# Patient Record
Sex: Female | Born: 1980 | Race: White | Hispanic: No | Marital: Married
Health system: Midwestern US, Academic
[De-identification: ages and names within clinical notes are randomized; demographics above are authoritative.]

## PROBLEM LIST (undated history)

## (undated) DIAGNOSIS — I2699 Other pulmonary embolism without acute cor pulmonale: ICD-10-CM

---

## 2017-05-17 ENCOUNTER — Encounter: Admit: 2017-05-17 | Discharge: 2017-05-18 | Payer: BC Managed Care – PPO

## 2017-05-17 DIAGNOSIS — R69 Illness, unspecified: Principal | ICD-10-CM

## 2017-05-18 ENCOUNTER — Encounter: Admit: 2017-05-18 | Discharge: 2017-05-18 | Payer: BC Managed Care – PPO

## 2017-05-18 ENCOUNTER — Ambulatory Visit: Admit: 2017-05-18 | Discharge: 2017-05-18 | Payer: BC Managed Care – PPO

## 2017-05-18 DIAGNOSIS — I2692 Saddle embolus of pulmonary artery without acute cor pulmonale: Principal | ICD-10-CM

## 2017-05-18 DIAGNOSIS — I2699 Other pulmonary embolism without acute cor pulmonale: ICD-10-CM

## 2017-05-18 DIAGNOSIS — R69 Illness, unspecified: Principal | ICD-10-CM

## 2017-05-18 MED ORDER — ACETAMINOPHEN 325 MG PO TAB
650 mg | ORAL | 0 refills | Status: DC | PRN
Start: 2017-05-18 — End: 2017-05-19
  Administered 2017-05-18 – 2017-05-19 (×3): 650 mg via ORAL

## 2017-05-18 MED ORDER — APIXABAN 5 MG PO TAB
10 mg | Freq: Two times a day (BID) | ORAL | 0 refills | Status: DC
Start: 2017-05-18 — End: 2017-05-19
  Administered 2017-05-19 (×2): 10 mg via ORAL

## 2017-05-18 MED ORDER — PERFLUTREN LIPID MICROSPHERES 1.1 MG/ML IV SUSP
1-20 mL | Freq: Once | INTRAVENOUS | 0 refills | Status: CP
Start: 2017-05-18 — End: ?
  Administered 2017-05-18: 1 mL via INTRAVENOUS

## 2017-05-18 NOTE — Care Plan
Reason For Transfer: EMTALA    Transfer Location: [facility]    Current Level of Service: ED    Discussed with Referring Provider (yes/no): NO    Transfer Center Triage Contact: Dannette BarbaraSharon    Pre-Transfer Course: [age] yof with hx thrombophlebitis, recent R calf pain, who started OCP 1 month ago and now presents with acute onset of CP, SOB this am.  Found to be hypoxia on admission to ER to sats of 70s.  CTA chest shows saddle PE.  BLE US neg forr DVTs.  VS AF, 120, 118/80, 20, 99% on 6 L NC.  labs remarkable for D Dimer 3500, WBC 12.3.  pt rec'd eliquis, 1 L NS, and fentanyl.  FHx positive with grandmother who had a recent diagnosis of PE too.  Pt accepted to a tele bed.

## 2017-05-18 NOTE — Progress Notes
36 yr with sudden onset of dyspnea with chest pain @ 0630, hypoxic in the 70's. CTA shows saddle PE.  Patient has recent hx of right calf pain.  US negative for DVT.  Her grandmother dxed w/saddle PE earlier this year.  BCP started one month ago, otherwise healthy.  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

## 2017-05-19 ENCOUNTER — Encounter: Admit: 2017-05-19 | Discharge: 2017-05-19 | Payer: BC Managed Care – PPO

## 2017-05-19 ENCOUNTER — Inpatient Hospital Stay: Admit: 2017-05-19 | Discharge: 2017-05-19 | Payer: BC Managed Care – PPO

## 2017-05-19 DIAGNOSIS — M549 Dorsalgia, unspecified: Principal | ICD-10-CM

## 2017-05-19 LAB — CARDIOLIPIN AB IGG/IGM

## 2017-05-19 LAB — CBC: Lab: 8.2 10*3/uL (ref 4.5–11.0)

## 2017-05-19 LAB — ANTITHROMBIN III (AT3): Lab: 117 % (ref <20.0)

## 2017-05-19 LAB — TROPONIN-I
Lab: 0.7 ng/mL — ABNORMAL HIGH (ref >60)
Lab: 0.5 ng/mL — ABNORMAL HIGH (ref 0.0–0.05)

## 2017-05-19 LAB — PREGNANCY TEST-URINE
Lab: NEGATIVE
Lab: 1

## 2017-05-19 LAB — BETA 2 GLYCOPROTEIN 1 AB, IGG

## 2017-05-19 LAB — BASIC METABOLIC PANEL: Lab: 140 MMOL/L (ref 137–147)

## 2017-05-19 LAB — BNP (B-TYPE NATRIURETIC PEPTI): Lab: 117 pg/mL — ABNORMAL HIGH (ref 0–100)

## 2017-05-19 MED ORDER — ACETAMINOPHEN 325 MG PO TAB
650 mg | ORAL | 0 refills | Status: DC | PRN
Start: 2017-05-19 — End: 2017-05-19
  Administered 2017-05-19: 18:00:00 650 mg via ORAL

## 2017-05-19 MED ORDER — MAGNESIUM SULFATE IN D5W 1 GRAM/100 ML IV PGBK
1 g | Freq: Once | INTRAVENOUS | 0 refills | Status: CP
Start: 2017-05-19 — End: ?
  Administered 2017-05-19: 21:00:00 1 g via INTRAVENOUS

## 2017-05-19 MED ORDER — FENTANYL CITRATE (PF) 50 MCG/ML IJ SOLN
50 ug | Freq: Once | INTRAVENOUS | 0 refills | Status: CP
Start: 2017-05-19 — End: ?
  Administered 2017-05-19: 50 ug via INTRAVENOUS

## 2017-05-19 MED ORDER — ENOXAPARIN 120 MG/0.8 ML SC SYRG
1 mg/kg | Freq: Two times a day (BID) | SUBCUTANEOUS | 0 refills | Status: DC
Start: 2017-05-19 — End: 2017-05-23
  Administered 2017-05-20 – 2017-05-22 (×6): 110 mg via SUBCUTANEOUS

## 2017-05-19 MED ORDER — PROCHLORPERAZINE EDISYLATE 5 MG/ML IJ SOLN
5 mg | Freq: Once | INTRAVENOUS | 0 refills | Status: CP
Start: 2017-05-19 — End: ?
  Administered 2017-05-19: 21:00:00 5 mg via INTRAVENOUS

## 2017-05-19 MED ORDER — ACETAMINOPHEN 500 MG PO TAB
1000 mg | Freq: Three times a day (TID) | ORAL | 0 refills | Status: DC | PRN
Start: 2017-05-19 — End: 2017-05-23
  Administered 2017-05-19 – 2017-05-22 (×10): 1000 mg via ORAL

## 2017-05-19 MED ORDER — IOPAMIDOL 61 % IV SOLN
40 mL | Freq: Once | INTRAVENOUS | 0 refills | Status: CP
Start: 2017-05-19 — End: ?
  Administered 2017-05-20: 40 mL via INTRAVENOUS

## 2017-05-19 NOTE — Case Management (ED)
Case Management Progress Note    NAME:Jordan Wang                          MRN: [id number]              DOB:21-Jun-1981          AGE: 36 y.o.  ADMISSION DATE: 05/18/2017             DAYS ADMITTED: LOS: 1 day      Today???s Date: 05/19/2017    Plan  Pt to discharge with anticoagulation medication.    Interventions  ? Support      ? Info or Referral      ? Discharge Planning      ? Medication Needs   Medication Needs: Co-Pay Check   Spoke with patients Rx coverage, [employer].  Pt has not yet met Rx deductible, will meet this deductible after 1st week of eliquis.  Per [employer] patient will owe the following:    Eliquis 10mg  BID for 7 days: $95.54  Eliquis 5mg  BID, 1 month supply after deductible is met: $96.48 ($182.67 if deductible is not met)    ? Financial      ? Legal      ? Other        Disposition  ? Expected Discharge Date    Expected Discharge Date: 05/23/17  ? Transportation   Does the patient need discharge transport arranged?: No  Transportation Name, Phone and Availability #1: spouse, Ronaldo Miyamoto [phone]  ? Next Level of Care (Acute Psych discharges only)      ? Discharge Disposition         Durable Medical Equipment     No service has been selected for the patient.      [facility]     No service has been selected for the patient.      [facility]     No service has been selected for the patient.      [facility]     No service has been selected for the patient.                Case Management Admission Assessment    NAME:Jordan Wang                          MRN: [id number]             DOB:March 27, 1981          AGE: 36 y.o.  ADMISSION DATE: 05/18/2017             DAYS ADMITTED: LOS: 1 day      Today???s Date: 05/19/2017    Source of Information: patient       Plan  Plan: CM Assessment, Assist PRN with SW/NCM Services, Discharge Planning for Home Anticipated   - Met with Mrs. losier and explained role of NCM.  - Reviewed plan for hospital admission and discharge.  - Anticipates discharge to home. - Transportation on discharge will be provided by husband, Ronaldo Miyamoto [phone]).  - pt to discharge with anticoagulation.  Co-pay check for eliquis complete.      Patient Address/Phone  [address]  [place] [address]  [phone] (home)     Emergency Contact  Extended Emergency Contact Information  Primary Emergency Contact: Ronelle Nigh [place]  Home Phone: [phone]  Mobile Phone: [phone]  Relation: Spouse  Healthcare Directive  Healthcare Directive: No, patient does not have a healthcare directive  Would patient like to fill out a (a new) Healthcare Directive?: No, patient declined      Transportation  Does the patient need discharge transport arranged?: No  Transportation Name, Phone and Availability #1: spouse, Ronaldo Miyamoto [phone]    Expected Discharge Date  Expected Discharge Date: 05/23/17    Living Situation Prior to Admission  ? Living Arrangements  Type of Residence: Home, independent  Living Arrangements: Spouse/significant other, Children (children age: 64, 100, twins are 58)  Financial risk analyst / Tub: Tub/Shower Unit  How many levels in the residence?: 3  Can patient live on one level if needed?: No  Does residence have entry and/or side stairs?: Yes (3 to enter)  Assistance needed prior to admit or anticipated on discharge: No  Who provides assistance or could if needed?: spouse and mother lives nearby  Are they in good health?: Unknown  Can support system provide 24/7 care if needed?: Maybe  ? Level of Function   Prior level of function: Independent  ? Cognitive Abilities   Cognitive Abilities: Alert and Oriented, Engages in problem solving and planning, Recognizes impact of health condition on lifestyle, Understands nature of health condition, Participates in decision making    Financial Resources  ? Coverage  Primary Insurance: Nurse, learning disability  Additional Coverage: RX ([employer])    ? Source of Income   Source Of Income: Employed  ? Financial Assistance Needed?  denies Psychosocial Needs  ? Mental Health  Mental Health History: No  ? Substance Use History  Substance Use History Screen: No  ? Other  denies    Current/Previous Services  ? PCP  [place], [place], [phone], [phone]  ? Pharmacy    [facility])  [phone] [address]  [place] [address]  Phone: [phone] Fax: [phone]    ? Durable Medical Equipment   Durable Medical Equipment at home: None  ? Home Health  Receiving home health: No  ? Hemodialysis or Peritoneal Dialysis  Undergoing hemodialysis or peritoneal dialysis: No  ? Tube/Enteral Feeds  Receive tube/enteral feeds: No  ? Infusion  Receive infusions: No  ? Private Duty  Private duty help used: No  ? Home and Community Based Services  Home and community based services: No  ? [employer]: N/A  ? Hospice  Hospice: No  ? Outpatient Therapy  PT: In the past  When did patient receive care?: 2017  Name of rehab location/group: [facility]  Would patient return for future services?: Yes  OT: No  SLP: No  ? Skilled Nursing Facility/Nursing Home  SNF: No  NH: No  ? Inpatient Rehab  IPR: No  ? Long-Term Acute Care Hospital  LTACH: No  ? Acute Hospital Stay  Acute Hospital Stay: No    Grayland Ormond, RN  Integrated Nursing Case Manager  Phone: [phone]  Pager: [phone]

## 2017-05-19 NOTE — Progress Notes
Gynecology Update Note     Patient wishes to discuss contraception options with her husband. She is considering following up with her OB/Gyn to get. Gynecology team will sign off. Please contact us if patient wishes to have nexplanon or IUD placed. Depo and progesterone pills also available inpatient.       Rosalio LoudHillary Nahom Carfagno, MD  PGY-3 Obsterics and Gynecology  Gyn page [phone]

## 2017-05-19 NOTE — Progress Notes
General Progress Note    Name:  Jordan Wang   Today's Date:  05/19/2017  Admission Date: 05/18/2017  LOS: 1 day                     Assessment/Plan:    Active Problems:    Pulmonary embolus (HCC)    Saddle pumonary embolus   Unprovoked   -Hemodynamically stable   -Has been on Ortho Tri-Cyclen for ~1 month for heavy menstrual cycles however she is a non-smoker  -OSH CT chest with saddle PE  -OSH R lower DVT showed superficial clot but no evidence of DVT  -Started on apixaban at OSH, has received 3 doses now  -Echo with:RV is moderately dilated with mild to moderate mid wall dysfunction seen in some views, although TAPSE and tissue doppler appear normal. Normal left ventricular systolic function with EF = 65% and normal diastolic function. Normal cardiac valve structure and function. Estimated pulmonary artery systolic pressure is 24 mmHg. Saddle PE (by history) was not seen in this limited study with poor visualization; however, this echo fits otherwise with PE as etiology.    -Repeat bilateral US ordered at [facility] [place] 05/19/2017--> Deep vein thrombosis, primarily acute in the right popliteal, posterior tibial and peroneal veins. No evidence of femoral popliteal deep vein thrombosis in the left leg  -Troponin 0.79 on admit, now 0.53  Plan:  -Pulm CC on board, appreciate assistance with management   >DC apixaban and do Lovonox 1mg /kg BID tonight and over the weekend in case she will need intervention on Monday  -Hypercoagulable work-up with factor five leiden, protein C and S, anticardiolipin, antib2 glycoprotein, lupus coagulant    -Consult gynecology for assistance with management since she has hx of menorrhagia and now stopping OCP, starting on anticoagulation  -Tylenol 1000mg  TID for pain control    Anemia   Hx of menorrhagia   Stable, continue to monitor   Plan as above    FEN: No fluids, replace electrolytes as needed, regular diet   PPX: on therapeutic lovonox  Code: FULL Dispo: Continue admit to med-2 for monitoring     Patient seen and discussed with Dr. Yetta Barre   ________________________________________________________________________    Subjective  Jordan Wang is a 36 y.o. female.  Patient is doing well and is feeling better. She is resting comfortably on RA, not in any respiratory distress. She complains of unchanged chest pain and a 'sinus-like HA'.     Denies fevers, chills, N, V, palpitations, SOB,  leg pain, diarrhea or constipation        Medications  Scheduled Meds:  apixaban (ELIQUIS) tablet 10 mg 10 mg Oral BID   Continuous Infusions:  PRN and Respiratory Meds:acetaminophen Q6H PRN      Review of Systems:  All other systems reviewed and are negative.    Objective:                          Vital Signs: Last Filed                 Vital Signs: 24 Hour Range   BP: 112/74 (11/16 0813)  Temp: 36.9 ???C (98.5 ???F) (11/16 0813)  Pulse: 91 (11/16 0813)  Respirations: 20 PER MINUTE (11/16 0813)  SpO2: 100 % (11/16 0813)  O2 Delivery: High Flow Nasal Cannula (11/16 0813)  Height: 172.7 cm (68) (11/15 1743) BP: (107-132)/(55-75)   Temp:  [36.3 ???C (97.3 ???F)-37.1 ???C (98.8 ???F)]  Pulse:  [89-114]   Respirations:  [20 PER MINUTE-21 PER MINUTE]   SpO2:  [97 %-100 %]   O2 Delivery: High Flow Nasal Cannula   Intensity Pain Scale (Self Report): 5 (05/19/17 0400) Vitals:    05/18/17 1547 05/18/17 1743   Weight: 113.1 kg (249 lb 5.4 oz) 113.1 kg (249 lb 5.4 oz)       Intake/Output Summary:  (Last 24 hours)    Intake/Output Summary (Last 24 hours) at 05/19/17 0850  Last data filed at 05/19/17 0400   Gross per 24 hour   Intake                0 ml   Output              551 ml   Net             -551 ml           Physical Exam  General:  Alert, cooperative, no distress, appears stated age  Head:  Normocephalic, without obvious abnormality, atraumatic  Eyes:  Conjunctivae/corneas clear.  PERRL, EOMs intact.  Neck:    Supple, symmetrical, trachea midline, no adenopathy, thyroid: no enlargement/tenderness/nodules, no carotid bruit and no JVD  Lungs:  Clear to auscultation bilaterally  Heart:   Regular rate and rhythm, S1, S2 normal, no murmur, click rub or gallop  Abdomen:  Soft, non-tender.  Bowel sounds normal.  No masses.  No organomegaly.  Extremities: Extremities normal, atraumatic, no cyanosis or edema. RLE TTP in mid calf  Peripheral pulses   2+ and symmetric, all extremities  Skin: Skin color, texture, turgor normal.  No rashes or lesions  Neurologic:   CNII - XII intact.  Normal strength, sensation and reflexes throughout.    Lab Review  24-hour labs:    Results for orders placed or performed during the hospital encounter of 05/18/17 (from the past 24 hour(s))   BNP (B-TYPE NATRIURETIC PEPTI)    Collection Time: 05/18/17  5:40 PM   Result Value Ref Range    B Type Natriuretic Peptide 117.0 (H) 0 - 100 PG/ML   TROPONIN-I    Collection Time: 05/18/17  5:40 PM   Result Value Ref Range    Troponin-I 0.79 (H) 0.0 - 0.05 NG/ML   PREGNANCY TEST-URINE    Collection Time: 05/18/17  6:40 PM   Result Value Ref Range    Urine-HCG NEG     Specific Gravity 1.021    TROPONIN-I    Collection Time: 05/18/17 11:56 PM   Result Value Ref Range    Troponin-I 0.53 (H) 0.0 - 0.05 NG/ML   CBC    Collection Time: 05/18/17 11:56 PM   Result Value Ref Range    White Blood Cells 8.2 4.5 - 11.0 K/UL    RBC 4.08 4.0 - 5.0 M/UL    Hemoglobin 11.4 (L) 12.0 - 15.0 GM/DL    Hematocrit 16.1 (L) 36 - 45 %    MCV 83.0 80 - 100 FL    MCH 28.1 26 - 34 PG    MCHC 33.8 32.0 - 36.0 G/DL    RDW 09.6 11 - 15 %    Platelet Count 194 150 - 400 K/UL    MPV 7.6 7 - 11 FL   BASIC METABOLIC PANEL    Collection Time: 05/18/17 11:56 PM   Result Value Ref Range    Sodium 140 137 - 147 MMOL/L    Potassium 3.8 3.5 - 5.1 MMOL/L  Chloride 110 98 - 110 MMOL/L    CO2 23 21 - 30 MMOL/L    Anion Gap 7 3 - 12    Glucose 98 70 - 100 MG/DL    Blood Urea Nitrogen 9 7 - 25 MG/DL    Creatinine 9.60 0.4 - 1.00 MG/DL    Calcium 9.2 8.5 - 45.4 MG/DL eGFR Non African American >60 >60 mL/min    eGFR African American >60 >60 mL/min       Point of Care Testing  (Last 24 hours)  Glucose: 98 (05/18/17 2356)    Radiology and other Diagnostics Review:    Pertinent radiology reviewed.    Lavenia Atlas, MD   Pager

## 2017-05-19 NOTE — H&P (View-Only)
IR Pre Procedure History and Physical/Sedation Plan    Name:Jordan Wang                                                                   MRN: [id number]                 DOB:03-Dec-1980          Age: 36 y.o.  Admission Date: 05/18/2017             Days Admitted: LOS: 1 day      Procedure Date: 05/19/2017     Planned Procedure(s):  IVC filter placement  Indication:  DVT, saddle pulmonary embolus    ________________________________________________________________________    Chief Complaint:  DVT    History of Present Illness: Jordan Wang is a 36 y.o. female.  Here for filter placement. No new acute complaints.  Active Problems:    Pulmonary embolus [facility])    Past Medical History:   Diagnosis Date   ??? Back pain       Past Surgical History:   Procedure Laterality Date   ??? CESAREAN SECTION     ??? CHOLECYSTECTOMY     ??? PR COMPLEX CYSTOMETROGRAM URETHRAL PRESS PROFILE      Urethral cyst removal    ??? WISDOM TEETH EXTRACTION          Medications:  Inpatient Scheduled Meds:  [MAR Hold] enoxaparin (LOVENOX) syringe 110 mg 1 mg/kg Subcutaneous BID   Continuous Infusions:  PRN and Respiratory Meds:[MAR Hold] acetaminophen TID PRN    Allergies:  Hydromorphone; Sulfamethoxazole-trimethoprim; and Trimethoprim    Social History     Social History   ??? Marital status: Married     Spouse name: N/A   ??? Number of children: N/A   ??? Years of education: N/A     Social History Main Topics   ??? Smoking status: Never Smoker   ??? Smokeless tobacco: Never Used   ??? Alcohol use Yes   ??? Drug use: No   ??? Sexual activity: Not on file     Other Topics Concern   ??? Not on file     Social History Narrative   ??? No narrative on file     Family history reviewed; non-contributory    Review of Systems:  All other systems reviewed and are negative.    Previous Anesthetic/Sedation History:  No      Physical Exam:                       Vital Signs: Last Filed                 Vital Signs: 24 Hour Range   BP: 145/103 (11/16 1702) Temp: 36.6 ???C (97.9 ???F) (11/16 1707)  Pulse: 87 (11/16 1707)  Respirations: 19 PER MINUTE (11/16 1707)  SpO2: 97 % (11/16 1707)  O2 Delivery: None (Room Air) (11/16 1707)  SpO2 Pulse: 87 (11/16 1707)  Height: 172.7 cm (68) (11/15 1743) BP: (107-145)/(66-103)   Temp:  [36.3 ???C (97.3 ???F)-37.1 ???C (98.8 ???F)]   Pulse:  [87-114]   Respirations:  [19 PER MINUTE-21 PER MINUTE]   SpO2:  [97 %-100 %]   O2 Delivery: None (Room Air)   Intensity Pain  Scale (Self Report): 4 (05/19/17 1546) Vitals:    05/18/17 1547 05/18/17 1743   Weight: 113.1 kg (249 lb 5.4 oz) 113.1 kg (249 lb 5.4 oz)         General appearance: fatigued and cooperative  Neurologic: Grossly normal  Lungs: nonlabored breathing  Heart: regular rate    Airway:  airway assessment performed  Mallampati II (soft palate, uvula, fauces visible)  Anesthesia Classification:  ASA III (A patient with a severe systemic disease that limits activity, but is not incapacitating)  NPO Status: Acceptable  Pregnancy Status: Not Pregnant  Sedation/Medication Plan:  Conscious sedation  Discussion/Reviews: Physician has discussed risks and alternatives of this type of sedation and above planned procedures with patient    Lab/Other Diagnostic Tests:  Labs: Pertinent labs reviewed      Lynder Parents, MD

## 2017-05-19 NOTE — Care Coordination-Inpatient
Patient transferred to Med 2 . Please page [phone] after 8am for questions.

## 2017-05-20 LAB — BASIC METABOLIC PANEL
Lab: 0.6 mg/dL (ref 0.4–1.00)
Lab: 107 MMOL/L (ref 98–110)
Lab: 138 MMOL/L (ref 137–147)
Lab: 22 MMOL/L (ref 21–30)
Lab: 3.9 MMOL/L (ref 3.5–5.1)
Lab: 6 mg/dL — ABNORMAL LOW (ref 7–25)
Lab: 9.3 mg/dL (ref 8.5–10.6)
Lab: 91 mg/dL (ref 70–100)
Lab: >60 mL/min (ref >60)

## 2017-05-20 LAB — FACTOR 5 ASSAY: Lab: 102 % (ref 50–150)

## 2017-05-20 LAB — CBC: Lab: 8.3 10*3/uL (ref 4.5–11.0)

## 2017-05-20 MED ORDER — POLYETHYLENE GLYCOL 3350 17 GRAM PO PWPK
1 | Freq: Every day | ORAL | 0 refills | Status: DC
Start: 2017-05-20 — End: 2017-05-23
  Administered 2017-05-20: 15:00:00 17 g via ORAL

## 2017-05-20 MED ORDER — SODIUM CHLORIDE 0.65 % NA SPRA
1-2 | NASAL | 0 refills | Status: DC | PRN
Start: 2017-05-20 — End: 2017-05-23

## 2017-05-20 NOTE — Other
Immediate Post Procedure Note    Date:  05/20/2017                                         Attending Physician:   Dr. Thomasena Edisollins  Performing Provider:  Laray AngerAaron Martha Soltys, MD    Consent:  Consent obtained from patient.  Time out performed: Consent obtained, correct patient verified, correct procedure verified, correct site verified, patient marked as necessary.  Pre Procedure Diagnosis/Indication:  DVT, saddle pulmonary embolus      Anesthesia: Local 10 mL 2% lidocaine without epinephrine, 0 mg Versed IV, 50 mcg Fentanyl IV  Procedure(s):  IVC filter placement. Please see separate PACS dictation for further detail.    Estimated Blood Loss:  None/Negligible  Specimen(s) Removed/Disposition: No  Complications: None  Patient Tolerated Procedure: Well  Post-Procedure Condition:  stable    Laray AngerAaron Makenleigh Crownover, MD  Pager [phone]

## 2017-05-20 NOTE — Progress Notes
General Progress Note    Name:  Jordan Wang   Today's Date:  05/20/2017  Admission Date: 05/18/2017  LOS: 2 days                     Assessment/Plan:    Active Problems:    Pulmonary embolus (HCC)    Acute deep vein thrombosis (DVT) of right popliteal vein (HCC)    Acute right heart failure (HCC)    Menorrhagia with regular cycle    Saddle pumonary embolus   RLE DVT  Unprovoked   -Hemodynamically stable   -Had been on Ortho Tri-Cyclen for ~1 month for heavy menstrual cycles however she is a non-smoker  -OSH CT chest with saddle PE  -OSH R lower DVT showed superficial clot but no evidence of DVT  -Started on apixaban at OSH  -Echo with:RV is moderately dilated with mild to moderate mid wall dysfunction seen in some views, although TAPSE and tissue doppler appear normal. Normal left ventricular systolic function with EF = 65% and normal diastolic function. Normal cardiac valve structure and function. Estimated pulmonary artery systolic pressure is 24 mmHg. Saddle PE (by history) was not seen in this limited study with poor visualization; however, this echo fits otherwise with PE as etiology.    -Repeat bilateral US ordered at [place] 05/19/2017--> Deep vein thrombosis, primarily acute in the right popliteal, posterior tibial and peroneal veins. No evidence of femoral popliteal deep vein thrombosis in the left leg  -Troponin 0.79 on admit, now 0.53  -Hypercoagulable work-up with  anticardiolipin, antib2 glycoprotein, and antithrombin negative Plan:  -Pulm CC on board, appreciate assistance with management   >DC apixaban and do Lovonox 1mg /kg BID tonight and over the weekend in case she will need intervention on Monday.   -Plan for repeat Echo Monday  -Continue to monitor hemodynamics  -Hypercoagulable labs pending   -Tylenol 1000mg  TID for pain control    Anemia   Hx of menorrhagia   Stable, continue to monitor   Plan:  Consult gynecology for assistance with management since she has hx of menorrhagia and now stopping OCP, starting on anticoagulation  >US with normal sized uterus, possible ademomyosis. Given alternatives to OCP. Patient wants to follow up with her outpatient gyn to decide IUD vs nexplanon    FEN: No fluids, replace electrolytes as needed, regular diet   PPX: on therapeutic lovonox  Code: FULL  Dispo: Continue admit to med-2, DC on apixaban once pharmacy checks for coverage    Patient seen and discussed with Dr. Yetta Barre   ________________________________________________________________________    Subjective  Jordan Wang is a 36 y.o. female.  Patient continues to do well. She is resting comfortably on RA, not in any respiratory distress. She complains of unchanged chest pain and a 'sinus-like HA' that is improving with being off the oxygen by NC. She also states that she has not had a BM since Thursday.    Denies fevers, chills, cough, N, V, palpitations, SOB,  leg pain, diarrhea    Medications  Scheduled Meds:    enoxaparin (LOVENOX) syringe 110 mg 1 mg/kg Subcutaneous BID   Continuous Infusions:  PRN and Respiratory Meds:acetaminophen TID PRN      Review of Systems:  All other systems reviewed and are negative.    Objective:                          Vital Signs: Last Filed  Vital Signs: 24 Hour Range   BP: 125/82 (11/17 0451)  Temp: 36.8 ???C (98.3 ???F) (11/17 0451)  Pulse: 83 (11/17 0451)  Respirations: 18 PER MINUTE (11/17 0451)  SpO2: 97 % (11/17 0451)  O2 Delivery: None (Room Air) (11/17 0451)  SpO2 Pulse: 91 (11/16 1815) BP: (100-145)/(73-103)   Temp:  [36.4 ???C (97.5 ???F)-36.9 ???C (98.5 ???F)]   Pulse:  [77-101]   Respirations:  [14 PER MINUTE-21 PER MINUTE]   SpO2:  [95 %-100 %]   O2 Delivery: None (Room Air)   Intensity Pain Scale (Self Report): Asleep (05/20/17 0557) Vitals:    05/18/17 1547 05/18/17 1743   Weight: 113.1 kg (249 lb 5.4 oz) 113.1 kg (249 lb 5.4 oz)       Intake/Output Summary:  (Last 24 hours)    Intake/Output Summary (Last 24 hours) at 05/20/17 0658 Last data filed at 05/20/17 0452   Gross per 24 hour   Intake             1310 ml   Output             2800 ml   Net            -1490 ml           Physical Exam  General:  Alert, cooperative, no distress, appears stated age  Head:  Normocephalic, without obvious abnormality, atraumatic  Eyes:  Conjunctivae/corneas clear.  PERRL, EOMs intact.  Lungs:  Clear to auscultation bilaterally  Heart:  Tachycardic, regular rhythm, S1, S2 normal, no murmur, click rub or gallop  Abdomen:  Soft, non-tender.  Bowel sounds normal.  No masses.  No organomegaly.  Extremities: Extremities normal, atraumatic, no cyanosis or edema.  Peripheral pulses   2+ and symmetric, all extremities  Skin: Skin color, texture, turgor normal.  No rashes or lesions    Lab Review  24-hour labs:    Results for orders placed or performed during the hospital encounter of 05/18/17 (from the past 24 hour(s))   CARDIOLIPIN AB IGG/IGM    Collection Time: 05/19/17 11:21 AM   Result Value Ref Range    Cardiolipin, IgG <1.6 <20.0 GPL/ML    Cardiolipin, IgM 1.2 <20.0 MPL/ML   ANTITHROMBIN III (AT3)    Collection Time: 05/19/17 11:21 AM   Result Value Ref Range    Anti-Thrombin 3 117 80 - 120 %   BETA 2 GLYCOPROTEIN 1 AB, IGG    Collection Time: 05/19/17 11:21 AM   Result Value Ref Range    BETA-2 GLY 1 AB ICG <1.4 <20.0 U/mL       Point of Care Testing  (Last 24 hours)       Radiology and other Diagnostics Review:    Pertinent radiology reviewed.    Lavenia Atlas, MD   Team Pager [phone]

## 2017-05-20 NOTE — Progress Notes
Interventional Radiology Progress Note      Admission Date: 05/18/2017  LOS: 2 days                     ________________________________________________________________________    Subjective  Jordan Wang is a 36 y.o. female.  NAEON. No acute complaints this morning. No pain at right IJ access site.    Medications  Scheduled Meds:  enoxaparin (LOVENOX) syringe 110 mg 1 mg/kg Subcutaneous BID   polyethylene glycol 3350 (MIRALAX) packet 17 g 1 packet Oral QDAY   Continuous Infusions:  PRN and Respiratory Meds:acetaminophen TID PRN      Objective                       Vital Signs: Last Filed                 Vital Signs: 24 Hour Range   BP: 126/85 (11/17 0823)  Temp: 37.2 ???C (98.9 ???F) (11/17 7253)  Pulse: 95 (11/17 0835)  Respirations: 18 PER MINUTE (11/17 0823)  SpO2: 97 % (11/17 0823)  O2 Delivery: None (Room Air) (11/17 0823)  SpO2 Pulse: 91 (11/16 1815) BP: (100-145)/(73-103)   Temp:  [36.4 ???C (97.5 ???F)-37.2 ???C (98.9 ???F)]   Pulse:  [77-101]   Respirations:  [14 PER MINUTE-21 PER MINUTE]   SpO2:  [95 %-100 %]   O2 Delivery: None (Room Air)   Intensity Pain Scale (Self Report): 3 (05/20/17 6644) Vitals:    05/18/17 1547 05/18/17 1743   Weight: 113.1 kg (249 lb 5.4 oz) 113.1 kg (249 lb 5.4 oz)         Intake/Output Summary:  (Last 24 hours)    Intake/Output Summary (Last 24 hours) at 05/20/17 0347  Last data filed at 05/20/17 4259   Gross per 24 hour   Intake              960 ml   Output             3400 ml   Net            -2440 ml           Physical Exam  General appearance: alert  Neurologic: Grossly normal  Lungs: nonlabored  Heart: regular rhythm  Extremities: lower extremity swelling   Neck: bandage over right IJ access site, dry and intact    Lab Review  Pertinent labs reviewed        Radiology and other Diagnostics Review:  procedural images reviewed    Assessment/Plan:    Active Problems:    Pulmonary embolus (HCC)    Acute deep vein thrombosis (DVT) of right popliteal vein (HCC) Acute right heart failure (HCC)    Menorrhagia with regular cycle    36 year old female presenting with saddle pulmonary embolus and lower extremity DVT.    - IVC filter successfully placed 05/19/17. Currently hemodynamically stable.  - IR will sign off. Feel free to consult after appropriate course of therapy for evaluation of IVC filter removal.    Lynder Parents, MD   PGY3 Radiology

## 2017-05-20 NOTE — Progress Notes
Pre/post procedural education provided. Questions and concerns addressed.  Discharge instructions attached to AVS.

## 2017-05-21 LAB — CBC: Lab: 7.7 K/UL — ABNORMAL HIGH (ref >60)

## 2017-05-21 LAB — BASIC METABOLIC PANEL
Lab: 138 MMOL/L — ABNORMAL HIGH (ref >60)
Lab: 3.8 MMOL/L — ABNORMAL HIGH (ref >60)
Lab: 7 g/dL (ref >60)
Lab: 8 mg/dL (ref >60)

## 2017-05-21 MED ORDER — EMU OIL 120ML
Freq: Every evening | TOPICAL | 0 refills | Status: DC | PRN
Start: 2017-05-21 — End: 2017-05-23
  Administered 2017-05-21: 19:00:00 120.000 mL via TOPICAL

## 2017-05-21 NOTE — Progress Notes
General Progress Note    Name:  Jordan Wang   Today's Date:  05/21/2017  Admission Date: 05/18/2017  LOS: 3 days                     Assessment/Plan:    Active Problems:    Pulmonary embolus (HCC)    Acute deep vein thrombosis (DVT) of right popliteal vein (HCC)    Acute right heart failure (HCC)    Menorrhagia with regular cycle    Saddle pumonary embolus   RLE DVT  Unprovoked   -Hemodynamically stable   -Had been on Ortho Tri-Cyclen for ~1 month for heavy menstrual cycles however she is a non-smoker  -OSH CT chest with saddle PE  -OSH R lower DVT showed superficial clot but no evidence of DVT  -Started on apixaban at OSH  -Echo with:RV is moderately dilated with mild to moderate mid wall dysfunction seen in some views, although TAPSE and tissue doppler appear normal. Normal left ventricular systolic function with EF = 65% and normal diastolic function. Normal cardiac valve structure and function. Estimated pulmonary artery systolic pressure is 24 mmHg. Saddle PE (by history) was not seen in this limited study with poor visualization; however, this echo fits otherwise with PE as etiology.    -Repeat bilateral US ordered at [place] 05/19/2017--> Deep vein thrombosis, primarily acute in the right popliteal, posterior tibial and peroneal veins. No evidence of femoral popliteal deep vein thrombosis in the left leg  -Troponin 0.79 on admit, now 0.53  -Hypercoagulable work-up with  anticardiolipin, antib2 glycoprotein, and antithrombin, factor five Leiden negative   Plan:  -Pulm CC on board, appreciate assistance with management   >DC apixaban and do Lovonox 1mg /kg BID tonight and over the weekend in case she will need intervention on Monday.   -Plan for repeat Echo Monday (ordered)  -Continue to monitor hemodynamics  -Tylenol 1000mg  TID, emu oil and warm/cold compresses for leg pain control    Anemia   Hx of menorrhagia   Stable, continue to monitor   Plan: Consult gynecology for assistance with management since she has hx of menorrhagia and now stopping OCP, starting on anticoagulation  >US with normal sized uterus, possible ademomyosis. Given alternatives to OCP. Patient wants to follow up with her outpatient gyn to decide IUD vs nexplanon    FEN: No fluids, replace electrolytes as needed, regular diet   PPX: on therapeutic lovonox  Code: FULL  Dispo: Continue admit to med-2, DC on apixaban once pharmacy checks for coverage    Patient seen and discussed with Dr. Yetta Barre   _______________________________________________________________________  Subjective  Jordan Wang is a 36 y.o. female.  Patient continues to do well. She is resting comfortably on RA, not in any respiratory distress. Unchanged pain in right leg over area of known DVT. She had  BM today. Ambulating without assistance.    Denies fevers, chills, cough, N, V, palpitations, SOB,  leg pain, diarrhea    Medications  Scheduled Meds:    enoxaparin (LOVENOX) syringe 110 mg 1 mg/kg Subcutaneous BID   polyethylene glycol 3350 (MIRALAX) packet 17 g 1 packet Oral QDAY   Continuous Infusions:  PRN and Respiratory Meds:acetaminophen TID PRN, sodium chloride PRN      Review of Systems:  All other systems reviewed and are negative.    Objective:                          Vital Signs:  Last Filed                 Vital Signs: 24 Hour Range   BP: 141/89 (11/18 0425)  Temp: 36.4 ???C (97.5 ???F) (11/18 0425)  Pulse: 79 (11/18 0425)  Respirations: 16 PER MINUTE (11/18 0425)  SpO2: 99 % (11/18 0425)  O2 Delivery: None (Room Air) (11/18 0425) BP: (122-146)/(62-89)   Temp:  [36.3 ???C (97.4 ???F)-37.2 ???C (98.9 ???F)]   Pulse:  [73-98]   Respirations:  [15 PER MINUTE-18 PER MINUTE]   SpO2:  [96 %-100 %]   O2 Delivery: None (Room Air)   Intensity Pain Scale (Self Report): 4 (05/21/17 0429) Vitals:    05/18/17 1547 05/18/17 1743 05/21/17 0600   Weight: 113.1 kg (249 lb 5.4 oz) 113.1 kg (249 lb 5.4 oz) 116.2 kg (256 lb 2.8 oz) Intake/Output Summary:  (Last 24 hours)    Intake/Output Summary (Last 24 hours) at 05/21/17 0656  Last data filed at 05/21/17 0459   Gross per 24 hour   Intake             1960 ml   Output             1400 ml   Net              560 ml      Stool Occurrence: 0    Physical Exam  General:  Alert, cooperative, no distress, appears stated age  Head:  Normocephalic, without obvious abnormality, atraumatic  Eyes:  Conjunctivae/corneas clear.  PERRL, EOMs intact.  Lungs:  Clear to auscultation bilaterally  Heart:  Tachycardic, regular rhythm, S1, S2 normal, no murmur, click rub or gallop  Abdomen:  Soft, non-tender.  Bowel sounds normal.  No masses.  No organomegaly.  Extremities: Extremities normal, atraumatic, no cyanosis or edema.  Peripheral pulses   2+ and symmetric, all extremities  Skin: Skin color, texture, turgor normal.  No rashes or lesions    Lab Review  24-hour labs:    Results for orders placed or performed during the hospital encounter of 05/18/17 (from the past 24 hour(s))   CBC    Collection Time: 05/20/17  7:23 AM   Result Value Ref Range    White Blood Cells 8.3 4.5 - 11.0 K/UL    RBC 4.39 4.0 - 5.0 M/UL    Hemoglobin 12.2 12.0 - 15.0 GM/DL    Hematocrit 16.1 36 - 45 %    MCV 83.4 80 - 100 FL    MCH 27.8 26 - 34 PG    MCHC 33.3 32.0 - 36.0 G/DL    RDW 09.6 11 - 15 %    Platelet Count 209 150 - 400 K/UL    MPV 8.1 7 - 11 FL   BASIC METABOLIC PANEL    Collection Time: 05/20/17  7:23 AM   Result Value Ref Range    Sodium 138 137 - 147 MMOL/L    Potassium 3.9 3.5 - 5.1 MMOL/L    Chloride 107 98 - 110 MMOL/L    CO2 22 21 - 30 MMOL/L    Anion Gap 9 3 - 12    Glucose 91 70 - 100 MG/DL    Blood Urea Nitrogen 6 (L) 7 - 25 MG/DL    Creatinine 0.45 0.4 - 1.00 MG/DL    Calcium 9.3 8.5 - 40.9 MG/DL    eGFR Non African American >60 >60 mL/min    eGFR African American >60 >60 mL/min   FACTOR  5 ASSAY    Collection Time: 05/20/17 11:35 AM   Result Value Ref Range    Factor 5 102 50 - 150 %   CBC Collection Time: 05/21/17  5:56 AM   Result Value Ref Range    White Blood Cells 7.7 4.5 - 11.0 K/UL    RBC 4.39 4.0 - 5.0 M/UL    Hemoglobin 12.3 12.0 - 15.0 GM/DL    Hematocrit 16.1 36 - 45 %    MCV 83.3 80 - 100 FL    MCH 27.9 26 - 34 PG    MCHC 33.5 32.0 - 36.0 G/DL    RDW 09.6 11 - 15 %    Platelet Count 198 150 - 400 K/UL    MPV 8.3 7 - 11 FL   BASIC METABOLIC PANEL    Collection Time: 05/21/17  5:56 AM   Result Value Ref Range    Sodium 138 137 - 147 MMOL/L    Potassium 3.8 3.5 - 5.1 MMOL/L    Chloride 107 98 - 110 MMOL/L    CO2 24 21 - 30 MMOL/L    Anion Gap 7 3 - 12    Glucose 91 70 - 100 MG/DL    Blood Urea Nitrogen 8 7 - 25 MG/DL    Creatinine 0.45 0.4 - 1.00 MG/DL    Calcium 9.2 8.5 - 40.9 MG/DL    eGFR Non African American >60 >60 mL/min    eGFR African American >60 >60 mL/min       Point of Care Testing  (Last 24 hours)  Glucose: 91 (05/21/17 0556)    Radiology and other Diagnostics Review:    Pertinent radiology reviewed.    Lavenia Atlas, MD   Team Pager [phone]

## 2017-05-22 ENCOUNTER — Encounter: Admit: 2017-05-22 | Discharge: 2017-05-22 | Payer: BC Managed Care – PPO

## 2017-05-22 ENCOUNTER — Encounter: Admit: 2017-05-18 | Discharge: 2017-05-18 | Payer: BC Managed Care – PPO

## 2017-05-22 ENCOUNTER — Inpatient Hospital Stay: Admit: 2017-05-19 | Discharge: 2017-05-19 | Payer: BC Managed Care – PPO

## 2017-05-22 ENCOUNTER — Inpatient Hospital Stay
Admit: 2017-05-18 | Discharge: 2017-05-22 | Disposition: A | Discharge status: Home / Self Care | Payer: BC Managed Care – PPO | Source: Other Acute Inpatient Hospital

## 2017-05-22 ENCOUNTER — Inpatient Hospital Stay: Admit: 2017-05-22 | Discharge: 2017-05-22 | Payer: BC Managed Care – PPO

## 2017-05-22 DIAGNOSIS — E785 Hyperlipidemia, unspecified: ICD-10-CM

## 2017-05-22 DIAGNOSIS — I809 Phlebitis and thrombophlebitis of unspecified site: ICD-10-CM

## 2017-05-22 DIAGNOSIS — I82441 Acute embolism and thrombosis of right tibial vein: ICD-10-CM

## 2017-05-22 DIAGNOSIS — I82431 Acute embolism and thrombosis of right popliteal vein: ICD-10-CM

## 2017-05-22 DIAGNOSIS — N920 Excessive and frequent menstruation with regular cycle: ICD-10-CM

## 2017-05-22 DIAGNOSIS — D6851 Activated protein C resistance: ICD-10-CM

## 2017-05-22 DIAGNOSIS — E669 Obesity, unspecified: ICD-10-CM

## 2017-05-22 DIAGNOSIS — Z6837 Body mass index (BMI) 37.0-37.9, adult: ICD-10-CM

## 2017-05-22 DIAGNOSIS — E119 Type 2 diabetes mellitus without complications: ICD-10-CM

## 2017-05-22 DIAGNOSIS — I2692 Saddle embolus of pulmonary artery without acute cor pulmonale: Principal | ICD-10-CM

## 2017-05-22 DIAGNOSIS — D649 Anemia, unspecified: ICD-10-CM

## 2017-05-22 DIAGNOSIS — I50811 Acute right heart failure: ICD-10-CM

## 2017-05-22 DIAGNOSIS — R000 Tachycardia, unspecified: ICD-10-CM

## 2017-05-22 DIAGNOSIS — I10 Essential (primary) hypertension: ICD-10-CM

## 2017-05-22 DIAGNOSIS — I2699 Other pulmonary embolism without acute cor pulmonale: Secondary | ICD-10-CM

## 2017-05-22 LAB — BASIC METABOLIC PANEL
Lab: 138 MMOL/L — ABNORMAL HIGH (ref >60)
Lab: 23 MMOL/L — ABNORMAL LOW (ref >60)
Lab: 3.9 MMOL/L — ABNORMAL HIGH (ref >60)

## 2017-05-22 LAB — CBC
Lab: 4.5 M/UL — ABNORMAL HIGH (ref 4.0–5.0)
Lab: 6.7 K/UL — ABNORMAL LOW (ref 4.5–11.0)

## 2017-05-22 MED ORDER — APIXABAN 5 MG PO TAB
5 mg | ORAL_TABLET | Freq: Two times a day (BID) | ORAL | 1 refills | Status: AC
Start: 2017-05-22 — End: 2017-12-14

## 2017-05-22 MED ORDER — APIXABAN 5 MG PO TAB
10 mg | ORAL_TABLET | Freq: Two times a day (BID) | ORAL | 0 refills | Status: AC
Start: 2017-05-22 — End: 2017-08-24

## 2017-05-22 NOTE — Discharge Instructions - Appointments
Please f/u with PCP in 1 week.   Please follow-up with hematology

## 2017-05-22 NOTE — Progress Notes
Jordan EastAngela Finnicum discharged on 05/22/2017.   Marland Kitchen.  Discharge instructions reviewed with patient  Valuables returned:   Personal Items / Valuables: Valuables/Belongings home with patient  Where Are Valuables Stored?: with pt.  Home medications:    .  Functional assessment at discharge complete: yes

## 2017-05-22 NOTE — Progress Notes
I have reviewed the notes, assessment, and/or procedures performed by Allison Audley, RN and concur with her/his documentation unless otherwise noted.

## 2017-05-22 NOTE — Case Management (ED)
Case Management Progress Note    NAME:Maddyn Boone MasterHenning                          MRN: [id number]              DOB:06/24/1981          AGE: 36 y.o.  ADMISSION DATE: 05/18/2017             DAYS ADMITTED: LOS: 4 days      Todays Date: 05/22/2017    Plan  Mrs. Limbach to discharge on eliquis.    Interventions  ? Support      ? Info or Referral      ? Discharge Planning      ? Medication Needs   Medication Needs: Medication Voucher   Provided Mrs. Petrosyan with medication voucher for $10/month for eliquis.  Educated to take the card to her pharmacy when picking up the medication.  All questions answered.  No other NCM needs at this time.  ? Financial      ? Legal      ? Other        Disposition  ? Expected Discharge Date    Expected Discharge Date: 05/23/17  ? Transportation   Does the patient need discharge transport arranged?: No  Transportation Name, Phone and Availability #1: spouse, Ronaldo MiyamotoKyle [phone]  ? Next Level of Care (Acute Psych discharges only)      ? Discharge Disposition          Durable Medical Equipment     No service has been selected for the patient.      [facility] [place] Destination     No service has been selected for the patient.      [facility]     No service has been selected for the patient.      Why Dialysis/Infusion     No service has been selected for the patient.        Grayland OrmondAndrea Wrigley Plasencia, RN  Integrated Nursing Case Manager  Phone: [phone]  Pager: [phone]

## 2017-05-22 NOTE — Patient Education
On 05/22/2017, Jordan Wang was counseled and provided with a list of her discharge medications as part of her After Visit Summary.  The patient was instructed to bring this medication list to her next doctor's appointment and to update the list with any medication changes.  Where indicated, the patient was provided with additional medication and/or disease-state information.    All patient questions were answered and patient acknowledged understanding of the medications, side effects, and other pertinent medication information.    I discussed with Marylene Landngela that she will be taking 10 mg apixaban twice daily for 7 days, followed by 5 mg twice daily thereafter. We discussed that she should monitor for shortness of breath and s/s of bleeds.    Basil Blakesley, [facility]HARMD  05/22/2017

## 2017-05-23 LAB — PROTEIN C ACTIVITY: Lab: 97 % (ref 70–130)

## 2017-05-23 LAB — FACTOR V LEIDEN SCREEN W CONFIRM: Lab: 1.8 ratio — ABNORMAL LOW (ref >2.3)

## 2017-05-24 ENCOUNTER — Encounter: Admit: 2017-05-24 | Discharge: 2017-05-24 | Payer: BC Managed Care – PPO

## 2017-05-27 LAB — FACTOR 5 LEIDEN

## 2017-05-31 LAB — HEX LUPUS ANTICOAGULANT: Lab: 8

## 2017-06-29 ENCOUNTER — Encounter: Admit: 2017-06-29 | Discharge: 2017-06-29 | Payer: BC Managed Care – PPO

## 2017-06-29 NOTE — Telephone Encounter
Intake  Document           Patient Name:  Jordan Wang, Jordan Wang     MRN#:    [id number]   DOB:   /10/1980   Insurance:   [employer] [place]     Appointment:    December 28th  Reason for Visit:  Saddle Pulmonary Embolism    Provider:    Dr. Freida BusmanAllen  [facility]      Referring:    Self referral       Info:  [facility], All Records in O2

## 2017-06-30 ENCOUNTER — Encounter: Admit: 2017-06-30 | Discharge: 2017-06-30 | Payer: BC Managed Care – PPO

## 2017-06-30 DIAGNOSIS — Z7901 Long term (current) use of anticoagulants: ICD-10-CM

## 2017-06-30 DIAGNOSIS — E669 Obesity, unspecified: ICD-10-CM

## 2017-06-30 DIAGNOSIS — Z86718 Personal history of other venous thrombosis and embolism: ICD-10-CM

## 2017-06-30 DIAGNOSIS — I2699 Other pulmonary embolism without acute cor pulmonale: Principal | ICD-10-CM

## 2017-06-30 DIAGNOSIS — D6851 Activated protein C resistance: ICD-10-CM

## 2017-06-30 DIAGNOSIS — N920 Excessive and frequent menstruation with regular cycle: ICD-10-CM

## 2017-06-30 DIAGNOSIS — I2692 Saddle embolus of pulmonary artery without acute cor pulmonale: Principal | ICD-10-CM

## 2017-06-30 DIAGNOSIS — M7989 Other specified soft tissue disorders: ICD-10-CM

## 2017-06-30 DIAGNOSIS — I82401 Acute embolism and thrombosis of unspecified deep veins of right lower extremity: ICD-10-CM

## 2017-06-30 NOTE — Progress Notes
06/30/2017    Name: Jordan Wang  DOB: 1980/11/12  MRN: [id number]    Primary Care Physician: Lona Kettle     Chief Complaint:   Chief Complaint   Patient presents with   ??? Heme/Onc Care   ??? New Patient     History of Present Illness   Jordan Wang is a 36 year old female presents the clinic today in consultation at the kind request of Dr. Herschell Dimes for anticoagulation recommendations following her recent saddle PE/RLE DVT.  I have reviewed hospital notes, labs, CT, lower extremity ultrasound for the visit today.  Mid November 2018 she experienced acute dyspnea, chest pressure.  She was at her local hospital in [place] where she was diagnosed with a saddle PE.  She was transferred to [facility] for further management.  She reported a history of significant menorrhagia.  An IVC filter was placed.  Prior to her PE she was on estrogen containing birth control pills, which she had just started approximately 1 month ago.  Despite being on birth control previously and having 3 children she has never had any prior blood clots.  During this visit she was also found to have a right lower extremity DVT.  She still does have some residual swelling.  Echocardiogram was done during the hospitalization and did show some mild right ventricular strain, repeat echocardiogram showed improvement prior to discharge.  She will plan to follow-up with pulmonary in February.  She has been on Eliquis and is tolerating it without difficulty.  She did have a paternal grandmother who had a PE, DVT at the age of 65 but had pneumonia 2 weeks prior.  Her last menses was quite significant while on the Eliquis.  At this time, she does not wish to have an IUD placed or use additional birth control as she is not sure what she wants to continue long-term.  During hospitalization she had hypercoagulability lab work completed which was unremarkable except for factor V Leiden heterozygosity.  She has since had 2 of her children tested, one which was negative and a daughter who is also a carrier for factor V Leiden.    Past Medical History  Past Medical History:   Diagnosis Date   ??? Obesity    ??? Pulmonary embolism (HCC) 05/2017   ??? Right leg DVT [facility])        Past Surgical History  Past Surgical History:   Procedure Laterality Date   ??? URETHRA SURGERY  2003    cyst remval   ??? VENA CAVA FILTER PLACEMENT  05/2017   ??? CESAREAN SECTION      x3   ??? CHOLECYSTECTOMY     ??? WISDOM TEETH EXTRACTION         Family History  Family History   Problem Relation Age of Onset   ??? Hyperlipidemia Father    ??? None Reported Sister    ??? None Reported Brother    ??? Blood Clots Paternal Grandmother 42        PE, DVT, had pnuemonia 2 weeks prior       Social History  Social History     Socioeconomic History   ??? Marital status: Married     Spouse name: Not on file   ??? Number of children: Not on file   ??? Years of education: Not on file   ??? Highest education level: Not on file   Social Needs   ??? Financial resource strain: Not on file   ??? Food insecurity -  worry: Not on file   ??? Food insecurity - inability: Not on file   ??? Transportation needs - medical: Not on file   ??? Transportation needs - non-medical: Not on file   Occupational History   ??? Not on file   Tobacco Use   ??? Smoking status: Never Smoker   ??? Smokeless tobacco: Never Used   Substance and Sexual Activity   ??? Alcohol use: No     Frequency: Never   ??? Drug use: No   ??? Sexual activity: Not on file   Other Topics Concern   ??? Not on file   Social History Narrative   ??? Not on file        Medications    Current Outpatient Medications:   ???  apixaban (ELIQUIS) 5 mg tablet, Take two tablets by mouth twice daily. Take 10 mg (2 tablets) twice daily for 7 days, followed by 5 mg (1 tablet) twice daily., Disp: 28 tablet, Rfl: 0  ???  apixaban (ELIQUIS) 5 mg tablet, Take one tablet by mouth twice daily. Do not start until you have completed your 7 day course of 10 mg twice daily dose., Disp: 60 tablet, Rfl: 1 ???  ferrous sulfate (FEOSOL, FEROSUL) 325 mg (65 mg iron) tablet, Take 325 mg by mouth daily., Disp: , Rfl: 2  ???  lorazepam (ATIVAN PO), Take  by mouth., Disp: , Rfl:      Allergies:   Allergies   Allergen Reactions   ??? Hydromorphone UNKNOWN   ??? Sulfamethoxazole-Trimethoprim RASH   ??? Trimethoprim RASH       Review of Systems  Review of Systems   Constitutional: Negative.    HENT: Negative.    Eyes: Negative.    Respiratory: Positive for chest tightness and shortness of breath.    Cardiovascular: Positive for leg swelling.   Gastrointestinal: Negative.    Endocrine: Negative.    Genitourinary: Negative.    Musculoskeletal: Negative.    Skin: Negative.    Allergic/Immunologic: Negative.    Neurological: Negative.    Hematological: Negative.    Psychiatric/Behavioral: Negative.    All other systems reviewed and are negative.      Pain Score: 0     Pain Addressed: N/A    Patient Evaluated for a Clinical Trial: Patient not eligible for a treatment trial (including not needing treatment, needs palliative care, in remission).    [place] Cooperative Oncology Group performance status is 0, Fully active, able to carry on all pre-disease performance without restriction.    Physical Exam  Vitals:    06/30/17 1418   BP: 120/71   Pulse: 88   Resp: 18   Temp: 36.9 ???C (98.4 ???F)   TempSrc: Oral   SpO2: 100%   Weight: 120.2 kg (265 lb)   Height: 172.7 cm (68)      Physical Exam   Constitutional: She is oriented to person, place, and time and well-developed, well-nourished, and in no distress. No distress.   Morbid obesity   Eyes: EOM are normal. No scleral icterus.   Cardiovascular: Normal rate, regular rhythm, normal heart sounds and intact distal pulses.   Pulmonary/Chest: Effort normal and breath sounds normal. No respiratory distress. She has no wheezes.   Abdominal: Soft. She exhibits no distension. There is no tenderness.   Musculoskeletal: She exhibits edema (1+ right lower extremity edema).   Lymphadenopathy: She has no cervical adenopathy.   Neurological: She is alert and oriented to person, place, and time. No cranial  nerve deficit. She exhibits normal muscle tone. Gait normal. Coordination normal.   Skin: No rash noted. No pallor.   Psychiatric: Mood, memory, affect and judgment normal.   Vitals reviewed.         Labs/ Imaging /Pathology     Reviewed as above    Assessment & Plan:  Dianna is a 36 year old female with the following medical problems:    1.  Concurrent saddle PE/RLE DVT November 2018, provoked by morbid obesity, estrogen containing birth control pills which have since been discontinued.  2.  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  3.  Right lower extremity swelling related to the prior blood clots, morbid obesity.  4.  Menorrhagia even prior to anticoagulation.    Today we discussed that 5% of Caucasians can be positive for factor V Leiden heterozygosity.  It can increase her risk of blood clots 5 times over the general population.  However, it is a factor V Leiden homozygous state that increases the blood clotting risk significantly at 80 times a general population.  As her blood clot was clearly provoked, she does not necessarily need lifelong anticoagulation.  She would prefer not to be on lifelong anticoagulation, which I do think is reasonable.    Current plan:  1.  Eliquis for 6 months through May 2019 which is being managed by her PCP.  2.  Follow-up as planned with pulmonary in February 2019 with repeat echocardiogram.  3.  Once her anticoagulation is discontinued, she could be referred back to [facility] interventional radiology for IVC removal.  Really we do remove the IVC filter at some point so it does not also serve as a nidus for further clots.  4.  At this time, she does not wish to be on any birth control.  However, if the menses do become significant, she will talk to her PCP further about progesterone only containing birth control options.  5.  Due to her obesity, the right lower extremity swelling may be permanent.  I recommended compression stockings, elevating the leg.  6.  Lifelong avoidance of tobacco and estrogen-containing products.  7.  Weight loss recommended.  8.  Daughters can be tested if they are considering using birth control, otherwise progesterone containing only birth control products would likely be best for them as well.  9.  Return to clinic as needed.    Thank you for this consultation and allowing me to participate in her care.      Parts of this note were created with voice recognition software. Please excuse any grammatical or typographical errors.

## 2017-08-24 ENCOUNTER — Ambulatory Visit: Admit: 2017-08-24 | Discharge: 2017-08-24 | Payer: BC Managed Care – PPO

## 2017-08-24 ENCOUNTER — Encounter: Admit: 2017-08-24 | Discharge: 2017-08-24 | Payer: BC Managed Care – PPO

## 2017-08-24 ENCOUNTER — Ambulatory Visit: Admit: 2017-08-24 | Discharge: 2017-08-25 | Payer: BC Managed Care – PPO

## 2017-08-24 DIAGNOSIS — M7989 Other specified soft tissue disorders: ICD-10-CM

## 2017-08-24 DIAGNOSIS — I420 Dilated cardiomyopathy: ICD-10-CM

## 2017-08-24 DIAGNOSIS — I2692 Saddle embolus of pulmonary artery without acute cor pulmonale: Principal | ICD-10-CM

## 2017-08-24 DIAGNOSIS — M79602 Pain in left arm: ICD-10-CM

## 2017-08-24 DIAGNOSIS — E669 Obesity, unspecified: ICD-10-CM

## 2017-08-24 DIAGNOSIS — I2699 Other pulmonary embolism without acute cor pulmonale: Principal | ICD-10-CM

## 2017-08-24 DIAGNOSIS — I82401 Acute embolism and thrombosis of unspecified deep veins of right lower extremity: ICD-10-CM

## 2017-08-24 LAB — BNP (B-TYPE NATRIURETIC PEPTI): Lab: 26 pg/mL (ref 0–100)

## 2017-08-24 LAB — TROPONIN-I

## 2017-08-29 ENCOUNTER — Encounter: Admit: 2017-08-29 | Discharge: 2017-08-29 | Payer: BC Managed Care – PPO

## 2017-08-29 DIAGNOSIS — I82431 Acute embolism and thrombosis of right popliteal vein: Principal | ICD-10-CM

## 2017-08-29 DIAGNOSIS — Z86711 Personal history of pulmonary embolism: ICD-10-CM

## 2017-09-20 ENCOUNTER — Ambulatory Visit: Admit: 2017-09-20 | Discharge: 2017-09-20 | Payer: BC Managed Care – PPO

## 2017-09-20 DIAGNOSIS — M7989 Other specified soft tissue disorders: ICD-10-CM

## 2017-09-20 DIAGNOSIS — I2692 Saddle embolus of pulmonary artery without acute cor pulmonale: Principal | ICD-10-CM

## 2017-09-20 DIAGNOSIS — M79602 Pain in left arm: ICD-10-CM

## 2017-09-21 ENCOUNTER — Encounter: Admit: 2017-09-21 | Discharge: 2017-09-21 | Payer: BC Managed Care – PPO

## 2017-09-25 ENCOUNTER — Encounter: Admit: 2017-09-25 | Discharge: 2017-09-25 | Payer: BC Managed Care – PPO

## 2017-12-13 ENCOUNTER — Encounter: Admit: 2017-12-13 | Discharge: 2017-12-13 | Payer: BC Managed Care – PPO

## 2017-12-13 DIAGNOSIS — Z86718 Personal history of other venous thrombosis and embolism: Principal | ICD-10-CM

## 2017-12-13 DIAGNOSIS — Z86711 Personal history of pulmonary embolism: ICD-10-CM

## 2017-12-14 ENCOUNTER — Encounter: Admit: 2017-12-14 | Discharge: 2017-12-14 | Payer: BC Managed Care – PPO

## 2017-12-14 ENCOUNTER — Ambulatory Visit: Admit: 2017-12-14 | Discharge: 2017-12-15 | Payer: BC Managed Care – PPO

## 2017-12-14 ENCOUNTER — Ambulatory Visit: Admit: 2017-12-14 | Discharge: 2017-12-14 | Payer: BC Managed Care – PPO

## 2017-12-14 DIAGNOSIS — Z86711 Personal history of pulmonary embolism: Principal | ICD-10-CM

## 2017-12-14 DIAGNOSIS — I2699 Other pulmonary embolism without acute cor pulmonale: Principal | ICD-10-CM

## 2017-12-14 DIAGNOSIS — I2692 Saddle embolus of pulmonary artery without acute cor pulmonale: ICD-10-CM

## 2017-12-14 DIAGNOSIS — I82431 Acute embolism and thrombosis of right popliteal vein: Principal | ICD-10-CM

## 2017-12-14 DIAGNOSIS — E669 Obesity, unspecified: ICD-10-CM

## 2017-12-14 DIAGNOSIS — Z86718 Personal history of other venous thrombosis and embolism: ICD-10-CM

## 2017-12-14 DIAGNOSIS — I82531 Chronic embolism and thrombosis of right popliteal vein: ICD-10-CM

## 2017-12-14 DIAGNOSIS — I82401 Acute embolism and thrombosis of unspecified deep veins of right lower extremity: ICD-10-CM

## 2017-12-14 DIAGNOSIS — I420 Dilated cardiomyopathy: ICD-10-CM

## 2017-12-14 LAB — D-DIMER: Lab: 607 ng{FEU}/mL — ABNORMAL HIGH (ref <500)

## 2017-12-14 MED ORDER — APIXABAN 5 MG PO TAB
5 mg | ORAL_TABLET | Freq: Two times a day (BID) | ORAL | 5 refills | Status: AC
Start: 2017-12-14 — End: ?

## 2017-12-27 ENCOUNTER — Encounter: Admit: 2017-12-27 | Discharge: 2017-12-27 | Payer: BC Managed Care – PPO

## 2017-12-27 DIAGNOSIS — Z95828 Presence of other vascular implants and grafts: ICD-10-CM

## 2017-12-27 DIAGNOSIS — I2699 Other pulmonary embolism without acute cor pulmonale: Principal | ICD-10-CM

## 2018-01-02 ENCOUNTER — Encounter: Admit: 2018-01-02 | Discharge: 2018-01-02 | Payer: BC Managed Care – PPO

## 2018-01-09 ENCOUNTER — Encounter: Admit: 2018-01-09 | Discharge: 2018-01-09 | Payer: BC Managed Care – PPO

## 2018-01-17 ENCOUNTER — Ambulatory Visit: Admit: 2018-01-17 | Discharge: 2018-01-17 | Payer: BC Managed Care – PPO

## 2018-01-17 DIAGNOSIS — Z885 Allergy status to narcotic agent status: ICD-10-CM

## 2018-01-17 DIAGNOSIS — Z881 Allergy status to other antibiotic agents status: ICD-10-CM

## 2018-01-17 DIAGNOSIS — Z452 Encounter for adjustment and management of vascular access device: Principal | ICD-10-CM

## 2018-01-17 DIAGNOSIS — Z882 Allergy status to sulfonamides status: ICD-10-CM

## 2018-01-17 DIAGNOSIS — Z86711 Personal history of pulmonary embolism: ICD-10-CM

## 2018-01-17 LAB — CBC AND DIFF
Lab: 0 10*3/uL (ref 0–0.20)
Lab: 0.1 10*3/uL (ref 0–0.45)
Lab: 0.4 10*3/uL (ref 0–0.80)
Lab: 1 % (ref 0–2)
Lab: 10 g/dL — ABNORMAL LOW (ref 12.0–15.0)
Lab: 16 % — ABNORMAL HIGH (ref 11–15)
Lab: 2 % (ref 0–5)
Lab: 2.2 10*3/uL (ref 1.0–4.8)
Lab: 2.9 10*3/uL (ref 1.8–7.0)
Lab: 22 pg — ABNORMAL LOW (ref 26–34)
Lab: 31 % — ABNORMAL LOW (ref >60)
Lab: 31 g/dL — ABNORMAL LOW (ref 32.0–36.0)
Lab: 326 10*3/uL (ref 150–400)
Lab: 39 % (ref 24–44)
Lab: 4.4 M/UL (ref 4.0–5.0)
Lab: 5.6 K/UL (ref 4.5–11.0)
Lab: 51 % (ref 41–77)
Lab: 7 % (ref 4–12)
Lab: 7.5 FL (ref 7–11)
Lab: 71 FL — ABNORMAL LOW (ref >60)

## 2018-01-17 MED ORDER — IOPAMIDOL 61 % IV SOLN
40 mL | Freq: Once | INTRAVENOUS | 0 refills | Status: CP
Start: 2018-01-17 — End: ?
  Administered 2018-01-17: 18:00:00 40 mL via INTRAVENOUS

## 2018-01-17 MED ORDER — MIDAZOLAM 1 MG/ML IJ SOLN
1 mg | Freq: Once | INTRAVENOUS | 0 refills | Status: DC
Start: 2018-01-17 — End: 2018-01-17

## 2018-01-17 MED ORDER — FENTANYL CITRATE (PF) 50 MCG/ML IJ SOLN
50 ug | Freq: Once | INTRAVENOUS | 0 refills | Status: DC
Start: 2018-01-17 — End: 2018-01-17

## 2018-05-01 ENCOUNTER — Encounter: Admit: 2018-05-01 | Discharge: 2018-05-01 | Payer: BC Managed Care – PPO

## 2018-05-01 DIAGNOSIS — Z86718 Personal history of other venous thrombosis and embolism: ICD-10-CM

## 2018-05-01 DIAGNOSIS — I82401 Acute embolism and thrombosis of unspecified deep veins of right lower extremity: Principal | ICD-10-CM

## 2018-05-01 DIAGNOSIS — Z86711 Personal history of pulmonary embolism: ICD-10-CM

## 2018-05-02 ENCOUNTER — Ambulatory Visit: Admit: 2018-05-02 | Discharge: 2018-05-02 | Payer: BC Managed Care – PPO

## 2018-05-02 DIAGNOSIS — Z86718 Personal history of other venous thrombosis and embolism: ICD-10-CM

## 2018-05-02 DIAGNOSIS — I82401 Acute embolism and thrombosis of unspecified deep veins of right lower extremity: Principal | ICD-10-CM

## 2018-05-02 DIAGNOSIS — Z86711 Personal history of pulmonary embolism: ICD-10-CM

## 2018-05-03 ENCOUNTER — Encounter: Admit: 2018-05-03 | Discharge: 2018-05-03 | Payer: BC Managed Care – PPO

## 2018-06-11 ENCOUNTER — Ambulatory Visit: Admit: 2018-06-11 | Discharge: 2018-06-11 | Payer: BC Managed Care – PPO

## 2018-06-11 ENCOUNTER — Encounter: Admit: 2018-06-11 | Discharge: 2018-06-11 | Payer: BC Managed Care – PPO

## 2018-06-11 DIAGNOSIS — Z86711 Personal history of pulmonary embolism: ICD-10-CM

## 2018-06-11 DIAGNOSIS — I82531 Chronic embolism and thrombosis of right popliteal vein: ICD-10-CM

## 2018-06-11 DIAGNOSIS — I2692 Saddle embolus of pulmonary artery without acute cor pulmonale: Principal | ICD-10-CM

## 2018-06-11 DIAGNOSIS — I2699 Other pulmonary embolism without acute cor pulmonale: Principal | ICD-10-CM

## 2018-06-11 DIAGNOSIS — I82401 Acute embolism and thrombosis of unspecified deep veins of right lower extremity: ICD-10-CM

## 2018-06-11 DIAGNOSIS — E669 Obesity, unspecified: ICD-10-CM

## 2018-06-11 MED ORDER — RP DX XE-133 XENON MCI
24.6 | Freq: Once | RESPIRATORY_TRACT | 0 refills | Status: CP
Start: 2018-06-11 — End: ?
  Administered 2018-06-11: 15:00:00 24.6 via RESPIRATORY_TRACT

## 2018-06-11 MED ORDER — RP DX TC-99M MAA MCI
5 | Freq: Once | INTRAVENOUS | 0 refills | Status: CP
Start: 2018-06-11 — End: ?
  Administered 2018-06-11: 15:00:00 5 via INTRAVENOUS

## 2018-06-12 ENCOUNTER — Encounter: Admit: 2018-06-12 | Discharge: 2018-06-12 | Payer: BC Managed Care – PPO

## 2018-06-12 DIAGNOSIS — I82401 Acute embolism and thrombosis of unspecified deep veins of right lower extremity: ICD-10-CM

## 2018-06-12 DIAGNOSIS — I2699 Other pulmonary embolism without acute cor pulmonale: Principal | ICD-10-CM

## 2018-06-12 DIAGNOSIS — E669 Obesity, unspecified: ICD-10-CM

## 2021-01-14 IMAGING — US USAXLT
1 series · 14 of 16 positions shown · non-contrast
Comparison: none

[Series 1: us axilla left · 53 acquisitions, 14 frames shown]
[im 1/53]
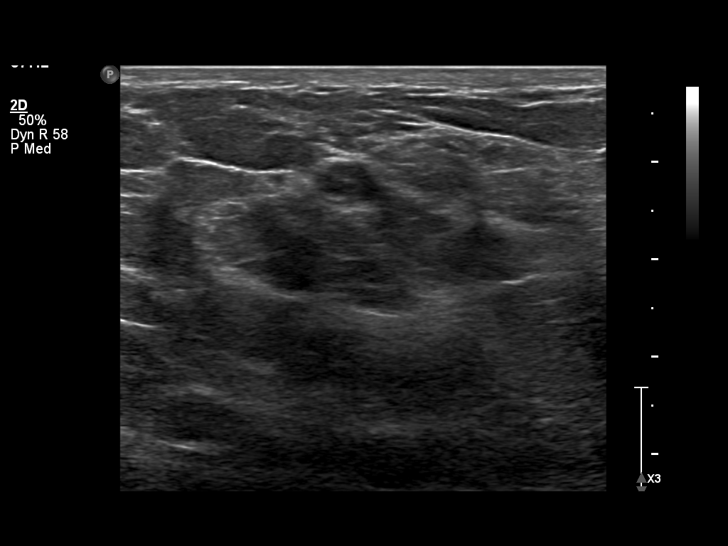
[im 4/53]
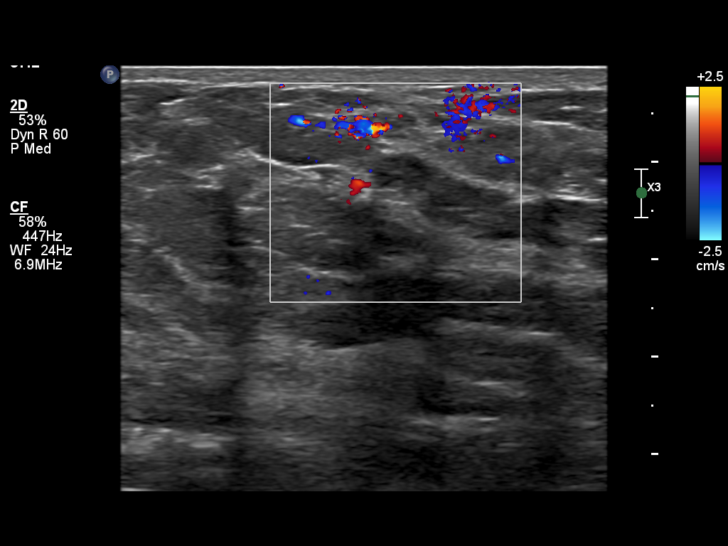
[im 7/53]
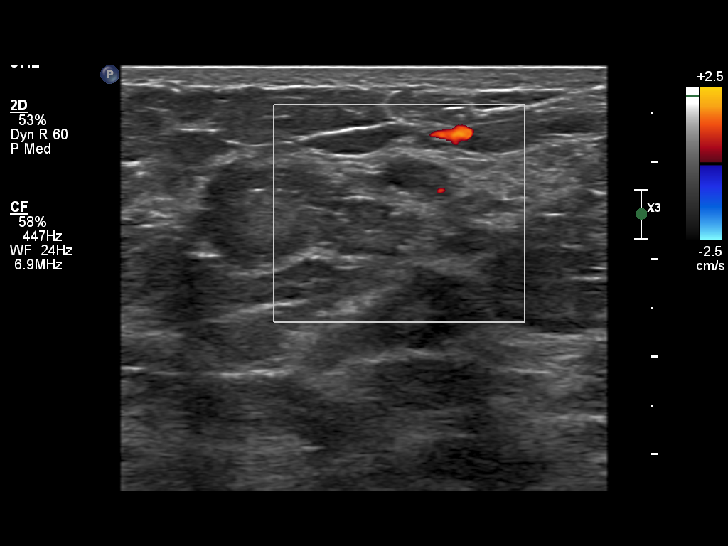
[im 14/53]
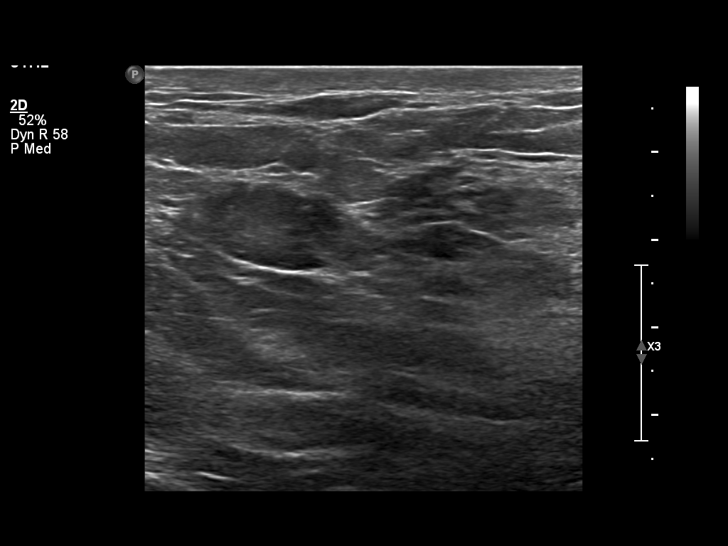
[im 18/53]
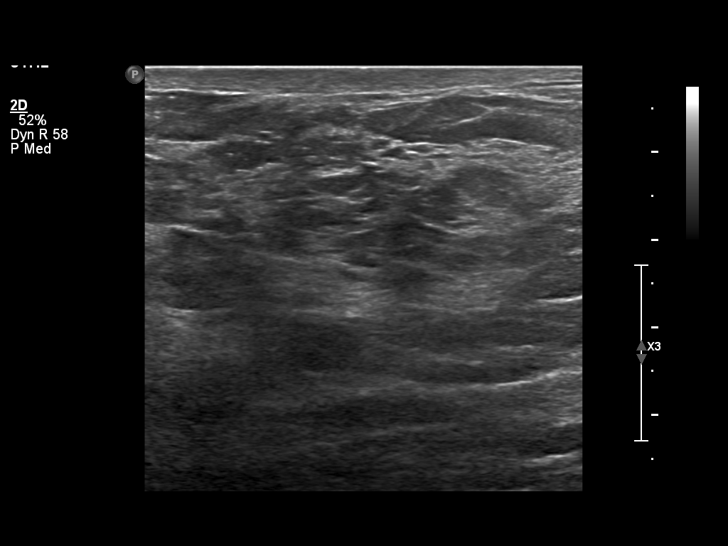
[im 21/53]
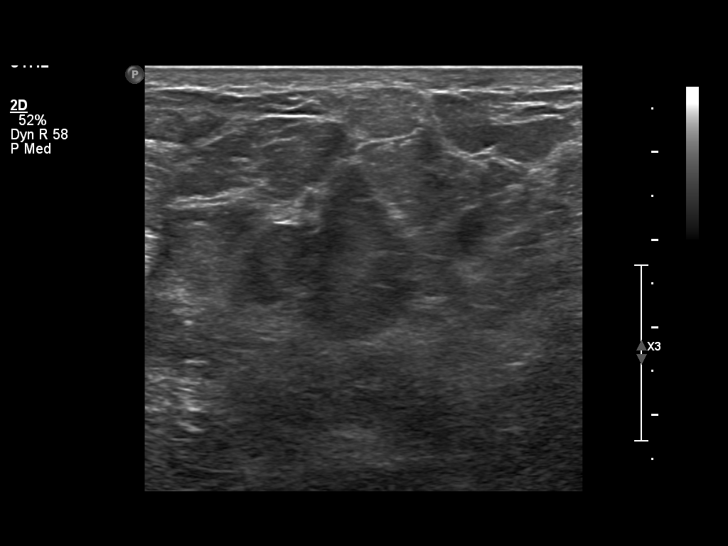
[im 25/53]
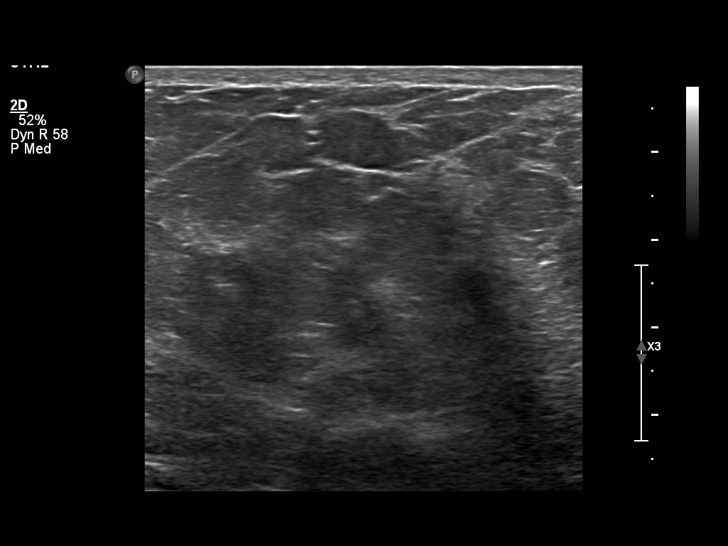
[im 28/53]
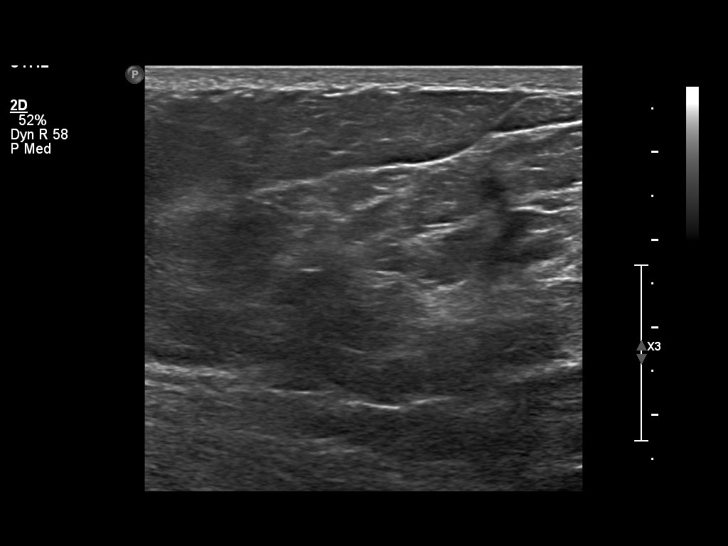
[im 32/53]
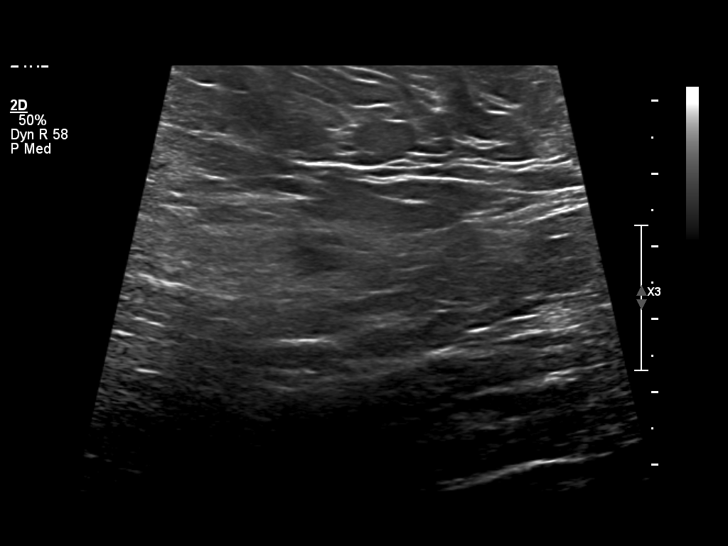
[im 35/53]
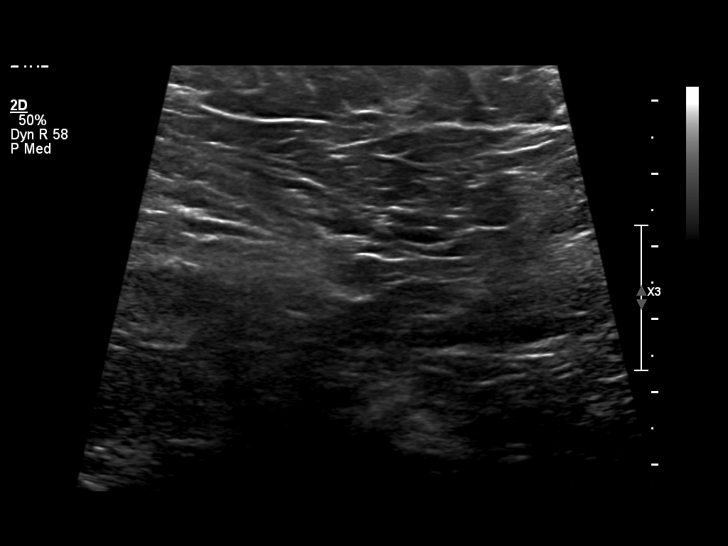
[im 42/53]
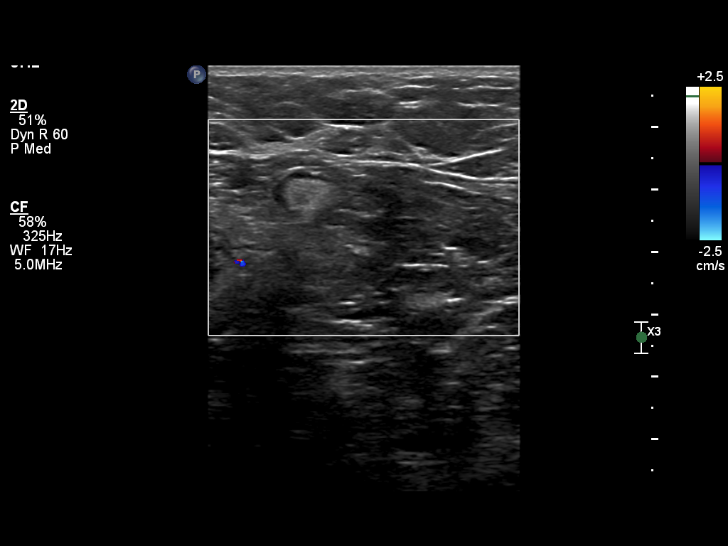
[im 46/53]
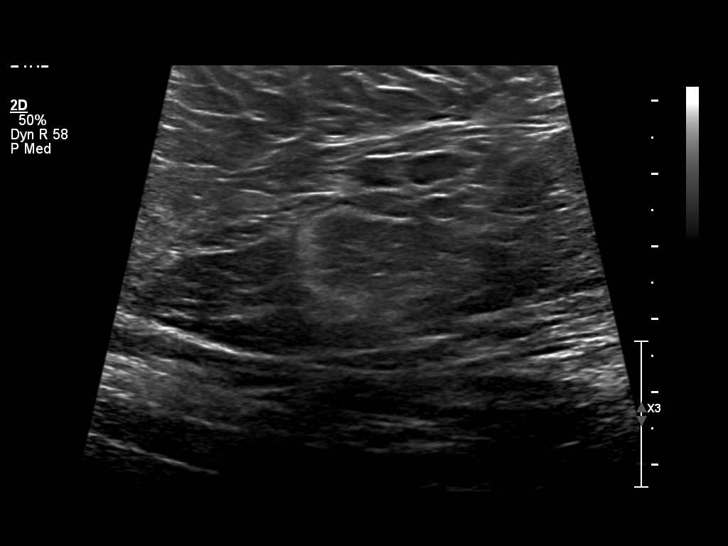
[im 49/53]
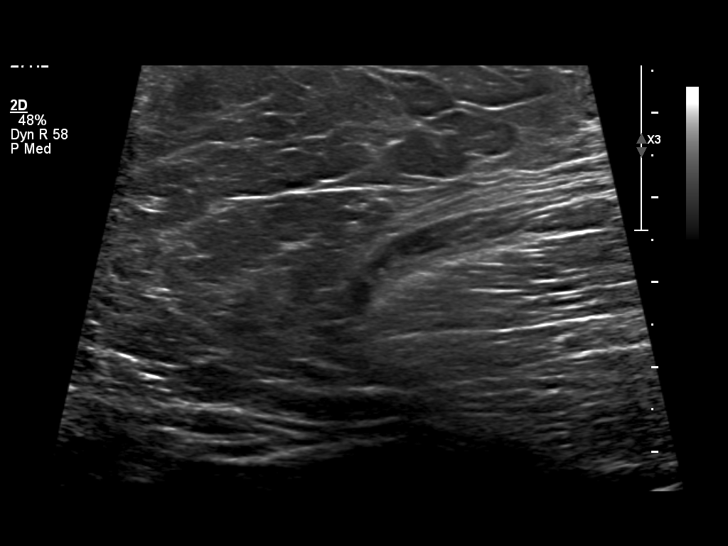
[im 53/53]
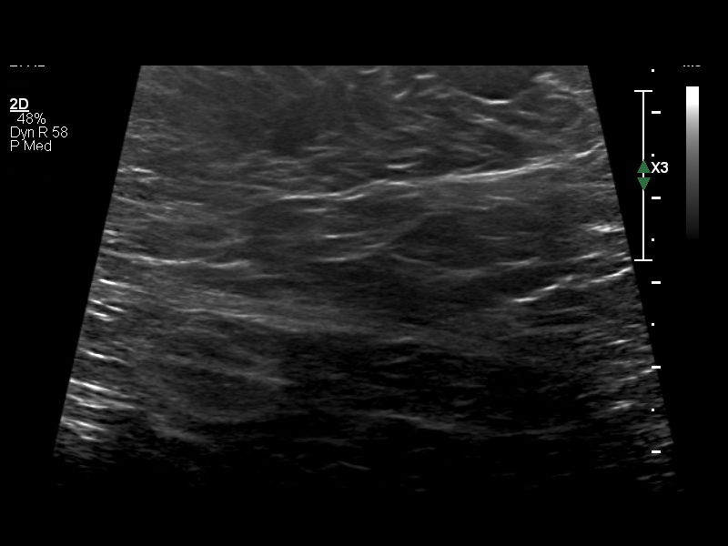

[14 of 16 positions shown; findings below may reference images not displayed]

DIAGNOSTIC STUDIES

US BREAST

EXAM

INDICATION

lump

TECHNIQUE

Grayscale and Color Doppler imaging evaluation in the region of concern was performed

COMPARISONS

None available

FINDINGS

Within the inferior medial breast, there is a small 7 millimeter hypoechoic area demonstrating an
apparent  fatty hilum corresponding well in size and location to the mammographic abnormality likely
reflecting a small intramammary lymph node. The location is atypical however.

Scanning of the left axilla reveals fatty replaced lymph nodes without concerning mass lesion.

IMPRESSION

Probably benign findings in left breast. Repeat left breast ultrasound and 3D mammography in 6
months is recommended.

BI - RADS 3

Tech Notes:

## 2022-08-24 ENCOUNTER — Encounter: Admit: 2022-08-24 | Discharge: 2022-08-24 | Payer: BC Managed Care – PPO
